# Patient Record
Sex: Male | Born: 1990 | Hispanic: Yes | Marital: Married | Smoking: Former smoker
Health system: Southern US, Community
[De-identification: ages and names within clinical notes are randomized; demographics above are authoritative.]

## PROBLEM LIST (undated history)

## (undated) DIAGNOSIS — S62336P Displaced fracture of neck of fifth metacarpal bone, right hand, subsequent encounter for fracture with malunion: Secondary | ICD-10-CM

## (undated) DIAGNOSIS — Z789 Other specified health status: Secondary | ICD-10-CM

## (undated) HISTORY — PX: NO PAST SURGERIES: SHX2092

---

## 2014-09-21 ENCOUNTER — Emergency Department (HOSPITAL_COMMUNITY)
Admission: EM | Admit: 2014-09-21 | Discharge: 2014-09-21 | Disposition: A | Discharge status: Home / Self Care | Payer: Self-pay | Attending: Emergency Medicine | Admitting: Emergency Medicine

## 2014-09-21 ENCOUNTER — Encounter (HOSPITAL_COMMUNITY): Payer: Self-pay | Admitting: Emergency Medicine

## 2014-09-21 DIAGNOSIS — Z720 Tobacco use: Secondary | ICD-10-CM | POA: Insufficient documentation

## 2014-09-21 DIAGNOSIS — B029 Zoster without complications: Secondary | ICD-10-CM | POA: Insufficient documentation

## 2014-09-21 MED ORDER — ACYCLOVIR 800 MG PO TABS: 800.0000 mg | ORAL_TABLET | Freq: Every day | ORAL | Status: DC

## 2014-09-21 MED ORDER — OXYCODONE-ACETAMINOPHEN 5-325 MG PO TABS
1.0000 | ORAL_TABLET | Freq: Once | ORAL | Status: AC
  Administered 2014-09-21: 1 via ORAL
  Filled 2014-09-21: qty 1

## 2014-09-21 MED ORDER — OXYCODONE-ACETAMINOPHEN 5-325 MG PO TABS: 1.0000 | ORAL_TABLET | Freq: Four times a day (QID) | ORAL | Status: DC | PRN

## 2014-09-21 NOTE — ED Provider Notes (Signed)
CSN: [id number]     Arrival date & time 09/21/14  0506 History   First MD Initiated Contact with Patient 09/21/14 0510     Chief Complaint  Patient presents with  . Herpes Zoster     (Consider location/radiation/quality/duration/timing/severity/associated sxs/prior Treatment) HPI  Patient states 1 week ago he was having some pain in his back. Then about 3 days later, July 26 he's noticed some small bumps on his upper back and neck area. He describes the lesions as burning and painful and itchy. He states that they are getting more fluid in them as the days go on. He states he's never had them before. He denies being around any known poison ivy or poison oak. He states he works in [employer]. He does play soccer but it's in a big open field. He was seen yesterday at urgent care and was prescribed prednisone 60 mg a day for 6 days and then a taper dose by Dr Wende Crease. However he was not told he had shingles.  PCP none  History reviewed. No pertinent past medical history. History reviewed. No pertinent past surgical history. History reviewed. No pertinent family history. History  Substance Use Topics  . Smoking status: Current Some Day Smoker  . Smokeless tobacco: Not on file  . Alcohol Use: No  employed  Review of Systems  All other systems reviewed and are negative.     Allergies  Review of patient's allergies indicates no known allergies.  Home Medications   Prior to Admission medications   Medication Sig Start Date End Date Taking? Authorizing Provider  acyclovir (ZOVIRAX) 800 MG tablet Take 1 tablet (800 mg total) by mouth 5 (five) times daily. 09/21/14   Devoria Albe, MD  oxyCODONE-acetaminophen (PERCOCET/ROXICET) 5-325 MG per tablet Take 1 tablet by mouth every 6 (six) hours as needed for severe pain. 09/21/14   Devoria Albe, MD   BP 133/88 mmHg  Pulse 76  Temp(Src) 99 F (37.2 C) (Oral)  Resp 18  Ht  (1.702 m)  Wt 140 lb (63.504 kg)  BMI 21.92 kg/m2  SpO2  100%  Vital signs normal   Physical Exam  Constitutional: He is oriented to person, place, and time. He appears well-developed and well-nourished.  Non-toxic appearance. He does not appear ill. No distress.  HENT:  Head: Normocephalic and atraumatic.  Right Ear: External ear normal.  Left Ear: External ear normal.  Nose: Nose normal. No mucosal edema or rhinorrhea.  Mouth/Throat: Mucous membranes are normal. No dental abscesses or uvula swelling.  Eyes: Conjunctivae and EOM are normal. Pupils are equal, round, and reactive to light.  Neck: Normal range of motion and full passive range of motion without pain. Neck supple.  Pulmonary/Chest: No respiratory distress. He has no rhonchi. He exhibits no crepitus.  Abdominal: Normal appearance.  Musculoskeletal: Normal range of motion.  Moves all extremities well.   Neurological: He is alert and oriented to person, place, and time. He has normal strength. No cranial nerve deficit.  Skin: Skin is warm, dry and intact. Rash noted. No erythema. No pallor.  Patient has a scattered unilateral rash some with clear fluid-filled blisters and some of the earlier ones  near his neck are turning purplish in approximately the C5 dermatome.  Psychiatric: He has a normal mood and affect. His speech is normal and behavior is normal. His mood appears not anxious.  Nursing note and vitals reviewed.        ED Course  Procedures (including critical care  time)  Medications  oxyCODONE-acetaminophen (PERCOCET/ROXICET) 5-325 MG per tablet 1 tablet (1 tablet Oral Given 09/21/14 1191)    We discussed adding acyclovir although it would've been better to start it sooner. Patient already has a prescription for prednisone he is encouraged to finish it. We discussed care of the lesions including Burow solution and Benadryl or Zyrtec for the itching component of the rash. We also discussed staying away from chemotherapy patients or pregnant patients.   Labs  Review Labs Reviewed - No data to display  Imaging Review No results found.   EKG Interpretation None      MDM   Final diagnoses:  Shingles    New Prescriptions   ACYCLOVIR (ZOVIRAX) 800 MG TABLET    Take 1 tablet (800 mg total) by mouth 5 (five) times daily.   OXYCODONE-ACETAMINOPHEN (PERCOCET/ROXICET) 5-325 MG PER TABLET    Take 1 tablet by mouth every 6 (six) hours as needed for severe pain.    Plan discharge  Devoria Albe, MD, Concha Pyo, MD 09/21/14 [phone]

## 2014-09-21 NOTE — Discharge Instructions (Signed)
Finish the prednisone you were already prescribed. Take the acyclovir until gone. Use the percocet for severe pain or so you can sleep. You can also take benadryl 25-50 mg OTC every 6 hrs for itching OR take zyrtec once daily for itching. You can use Burrow's solution on the blisters to help them dry up. STAY AWAY from any cancer patient getting chemotherapy or pregnant women.    Shingles Shingles (herpes zoster) is an infection that is caused by the same virus that causes chickenpox (varicella). The infection causes a painful skin rash and fluid-filled blisters, which eventually break open, crust over, and heal. It may occur in any area of the body, but it usually affects only one side of the body or face. The pain of shingles usually lasts about 1 month. However, some people with shingles may develop long-term (chronic) pain in the affected area of the body. Shingles often occurs many years after the person had chickenpox. It is more common:  In people older than 50 years.  In people with weakened immune systems, such as those with HIV, AIDS, or cancer.  In people taking medicines that weaken the immune system, such as transplant medicines.  In people under great stress. CAUSES  Shingles is caused by the varicella zoster virus (VZV), which also causes chickenpox. After a person is infected with the virus, it can remain in the person's body for years in an inactive state (dormant). To cause shingles, the virus reactivates and breaks out as an infection in a nerve root. The virus can be spread from person to person (contagious) through contact with open blisters of the shingles rash. It will only spread to people who have not had chickenpox. When these people are exposed to the virus, they may develop chickenpox. They will not develop shingles. Once the blisters scab over, the person is no longer contagious and cannot spread the virus to others. SIGNS AND SYMPTOMS  Shingles shows up in stages. The  initial symptoms may be pain, itching, and tingling in an area of the skin. This pain is usually described as burning, stabbing, or throbbing.In a few days or weeks, a painful red rash will appear in the area where the pain, itching, and tingling were felt. The rash is usually on one side of the body in a band or belt-like pattern. Then, the rash usually turns into fluid-filled blisters. They will scab over and dry up in approximately 2-3 weeks. Flu-like symptoms may also occur with the initial symptoms, the rash, or the blisters. These may include:  Fever.  Chills.  Headache.  Upset stomach. DIAGNOSIS  Your health care provider will perform a skin exam to diagnose shingles. Skin scrapings or fluid samples may also be taken from the blisters. This sample will be examined under a microscope or sent to a lab for further testing. TREATMENT  There is no specific cure for shingles. Your health care provider will likely prescribe medicines to help you manage the pain, recover faster, and avoid long-term problems. This may include antiviral drugs, anti-inflammatory drugs, and pain medicines. HOME CARE INSTRUCTIONS   Take a cool bath or apply cool compresses to the area of the rash or blisters as directed. This may help with the pain and itching.   Take medicines only as directed by your health care provider.   Rest as directed by your health care provider.  Keep your rash and blisters clean with mild soap and cool water or as directed by your health care provider.  Do not pick your blisters or scratch your rash. Apply an anti-itch cream or numbing creams to the affected area as directed by your health care provider.  Keep your shingles rash covered with a loose bandage (dressing).  Avoid skin contact with:  Babies.   Pregnant women.   Children with eczema.   Elderly people with transplants.   People with chronic illnesses, such as leukemia or AIDS.   Wear loose-fitting  clothing to help ease the pain of material rubbing against the rash.  Keep all follow-up visits as directed by your health care provider.If the area involved is on your face, you may receive a referral for a specialist, such as an eye doctor (ophthalmologist) or an ear, nose, and throat (ENT) doctor. Keeping all follow-up visits will help you avoid eye problems, chronic pain, or disability.  SEEK IMMEDIATE MEDICAL CARE IF:   You have facial pain, pain around the eye area, or loss of feeling on one side of your face.  You have ear pain or ringing in your ear.  You have loss of taste.  Your pain is not relieved with prescribed medicines.   Your redness or swelling spreads.   You have more pain and swelling.  Your condition is worsening or has changed.   You have a fever. MAKE SURE YOU:  Understand these instructions.  Will watch your condition.  Will get help right away if you are not doing well or get worse. Document Released: 02/08/2005 Document Revised: 06/25/2013 Document Reviewed: 09/23/2011 [facility] Patient Information 2015 [place], [place]. This information is not intended to replace advice given to you by your health care provider. Make sure you discuss any questions you have with your health care provider.

## 2014-09-21 NOTE — ED Notes (Signed)
Patient complaining of painful rash to back of neck and running across top of shoulders and around to chest. States he noticed the rash Tuesday, and it has spread and worsened.

## 2015-12-16 ENCOUNTER — Emergency Department (HOSPITAL_COMMUNITY)
Admission: EM | Admit: 2015-12-16 | Discharge: 2015-12-16 | Disposition: A | Discharge status: Home / Self Care | Payer: Self-pay | Attending: Emergency Medicine | Admitting: Emergency Medicine

## 2015-12-16 ENCOUNTER — Encounter (HOSPITAL_COMMUNITY): Payer: Self-pay | Admitting: Emergency Medicine

## 2015-12-16 ENCOUNTER — Emergency Department (HOSPITAL_COMMUNITY): Payer: Self-pay

## 2015-12-16 DIAGNOSIS — F17200 Nicotine dependence, unspecified, uncomplicated: Secondary | ICD-10-CM | POA: Insufficient documentation

## 2015-12-16 DIAGNOSIS — Y9389 Activity, other specified: Secondary | ICD-10-CM | POA: Insufficient documentation

## 2015-12-16 DIAGNOSIS — Y929 Unspecified place or not applicable: Secondary | ICD-10-CM | POA: Insufficient documentation

## 2015-12-16 DIAGNOSIS — S62326A Displaced fracture of shaft of fifth metacarpal bone, right hand, initial encounter for closed fracture: Secondary | ICD-10-CM | POA: Insufficient documentation

## 2015-12-16 DIAGNOSIS — S62306A Unspecified fracture of fifth metacarpal bone, right hand, initial encounter for closed fracture: Secondary | ICD-10-CM

## 2015-12-16 DIAGNOSIS — W2201XA Walked into wall, initial encounter: Secondary | ICD-10-CM | POA: Insufficient documentation

## 2015-12-16 DIAGNOSIS — Y999 Unspecified external cause status: Secondary | ICD-10-CM | POA: Insufficient documentation

## 2015-12-16 MED ORDER — HYDROCODONE-ACETAMINOPHEN 5-325 MG PO TABS: 1.0000 | ORAL_TABLET | ORAL | 0 refills | Status: DC | PRN

## 2015-12-16 NOTE — ED Triage Notes (Signed)
Patient states he punched a wall about a week ago with right hand. States pain and swelling right hand.

## 2015-12-16 NOTE — Discharge Instructions (Signed)
Follow up with the hand doctor as this injury will need surgery. Do not drive or operate heavy machinery while taking the pain medication. Return to the ED if you develop new or worsening symptoms.

## 2015-12-16 NOTE — ED Notes (Signed)
Pt made aware to return if symptoms worsen or if any life threatening symptoms occur.   

## 2015-12-16 NOTE — ED Provider Notes (Signed)
[facility]-EMERGENCY DEPT Provider Note   CSN: [id number] Arrival date & time: 12/16/15  [id number]  By signing my name below, I, Sonum Patel, attest that this documentation has been prepared under the direction and in the presence of Glynn OctaveStephen Wylie Russon, MD. Electronically Signed: Sonum Patel, Neurosurgeoncribe. 12/16/15. 8:35 AM.  History   Chief Complaint Chief Complaint  Patient presents with  . Hand Injury    The history is provided by the patient. No language interpreter was used.     HPI Comments: Charles Washington is a 25 y.o. male who presents to the Emergency Department complaining of constant, unchanged right hand pain with associated swelling for the past 10 days after punching a wall. He states he has not yet been evaluated for his symptoms. He denies wrist pain.    History reviewed. No pertinent past medical history.  There are no active problems to display for this patient.   History reviewed. No pertinent surgical history.     Home Medications    Prior to Admission medications   Medication Sig Start Date End Date Taking? Authorizing Provider  acyclovir (ZOVIRAX) 800 MG tablet Take 1 tablet (800 mg total) by mouth 5 (five) times daily. 09/21/14   Devoria AlbeIva Knapp, MD  oxyCODONE-acetaminophen (PERCOCET/ROXICET) 5-325 MG per tablet Take 1 tablet by mouth every 6 (six) hours as needed for severe pain. 09/21/14   Devoria AlbeIva Knapp, MD    Family History No family history on file.  Social History Social History  Substance Use Topics  . Smoking status: Current Some Day Smoker  . Smokeless tobacco: Never Used  . Alcohol use Yes     Comment: occasonally     Allergies   Review of patient's allergies indicates no known allergies.   Review of Systems Review of Systems  A complete 10 system review of systems was obtained and all systems are negative except as noted in the HPI and PMH.    Physical Exam Updated Vital Signs BP 139/94 (BP Location: Left Arm)   Pulse 83   Temp 98.4 F (36.9 C) (Oral)    Resp 16   Ht 5\' 7"  (1.702 m)   Wt 170 lb (77.1 kg)   SpO2 99%   BMI 26.63 kg/m   Physical Exam  Constitutional: He is oriented to person, place, and time. He appears well-developed and well-nourished. No distress.  HENT:  Head: Normocephalic and atraumatic.  Mouth/Throat: Oropharynx is clear and moist. No oropharyngeal exudate.  Eyes: Conjunctivae and EOM are normal. Pupils are equal, round, and reactive to light.  Neck: Normal range of motion. Neck supple.  No meningismus.  Cardiovascular: Normal rate, regular rhythm, normal heart sounds and intact distal pulses.   No murmur heard. Pulmonary/Chest: Effort normal and breath sounds normal. No respiratory distress.  Abdominal: Soft. There is no tenderness. There is no rebound and no guarding.  Musculoskeletal: Normal range of motion. He exhibits tenderness and deformity. He exhibits no edema.  Deformity to right 5th metacarpal with tenderness. No open wounds. Full ROM of fingers. No snuffbox tenderness. Intact radial pulse.   Neurological: He is alert and oriented to person, place, and time. No cranial nerve deficit. He exhibits normal muscle tone. Coordination normal.  No ataxia on finger to nose bilaterally. No pronator drift. 5/5 strength throughout. CN 2-12 intact.Equal grip strength. Sensation intact.   Skin: Skin is warm.  Psychiatric: He has a normal mood and affect. His behavior is normal.  Nursing note and vitals reviewed.    ED Treatments /  Results  DIAGNOSTIC STUDIES: Oxygen Saturation is 99% on RA, normal by my interpretation.    COORDINATION OF CARE: 8:34 AM Discussed treatment plan with pt at bedside and pt agreed to plan.    Labs (all labs ordered are listed, but only abnormal results are displayed) Labs Reviewed - No data to display  EKG  EKG Interpretation None       Radiology Dg Hand Complete Right  Result Date: 12/16/2015 CLINICAL DATA:  Punched a wall 4 days ago. Right fifth metacarpal pain.  EXAM: RIGHT HAND - COMPLETE 3+ VIEW COMPARISON:  None. FINDINGS: There is an angulated fracture through the midportion of the right fifth metacarpal. No subluxation or dislocation. Overlying soft tissue swelling. IMPRESSION: Mildly angulated right fifth metacarpal fracture. Electronically Signed   By: Charlett Nose M.D.   On: 12/16/2015 09:07    Procedures Procedures (including critical care time)  Medications Ordered in ED Medications - No data to display   Initial Impression / Assessment and Plan / ED Course  I have reviewed the triage vital signs and the nursing notes.  Pertinent labs & imaging results that were available during my care of the patient were reviewed by me and considered in my medical decision making (see chart for details).  Clinical Course   Right hand injury after punching wall tenderness ago. No open wounds. Neurovascular intact.  X-ray confirms midshaft angulated right fifth metacarpal fracture.  D/w OR nurse for Dr. Dion Saucier.  Recommends splinting, does not recommend attempted reduction. Followup in office today or tomorrow.    Final Clinical Impressions(s) / ED Diagnoses   Final diagnoses:  Closed fracture of fifth metacarpal bone of right hand, unspecified fracture morphology, initial encounter    New Prescriptions New Prescriptions   No medications on file   I personally performed the services described in this documentation, which was scribed in my presence. The recorded information has been reviewed and is accurate.    Glynn Octave, MD 12/16/15 [phone]

## 2015-12-17 ENCOUNTER — Other Ambulatory Visit: Payer: Self-pay | Admitting: Orthopedic Surgery

## 2015-12-17 NOTE — Progress Notes (Signed)
Spoke with pt's girlfriend, she states pt is at work and will not be home until after 10 PM. She states I could give her the pre-op instructions. Sh voiced understanding after instructions were given.

## 2015-12-18 ENCOUNTER — Ambulatory Visit (HOSPITAL_COMMUNITY): Payer: Self-pay | Admitting: Anesthesiology

## 2015-12-18 ENCOUNTER — Encounter (HOSPITAL_COMMUNITY)
Admission: RE | Disposition: A | Discharge status: Home / Self Care | Payer: Self-pay | Source: Ambulatory Visit | Attending: Orthopedic Surgery

## 2015-12-18 ENCOUNTER — Ambulatory Visit (HOSPITAL_COMMUNITY)
Admission: RE | Admit: 2015-12-18 | Discharge: 2015-12-18 | Disposition: A | Discharge status: Home / Self Care | Payer: Self-pay | Source: Ambulatory Visit | Attending: Orthopedic Surgery | Admitting: Orthopedic Surgery

## 2015-12-18 ENCOUNTER — Encounter (HOSPITAL_COMMUNITY): Payer: Self-pay | Admitting: Urology

## 2015-12-18 DIAGNOSIS — Z87891 Personal history of nicotine dependence: Secondary | ICD-10-CM | POA: Insufficient documentation

## 2015-12-18 DIAGNOSIS — X58XXXA Exposure to other specified factors, initial encounter: Secondary | ICD-10-CM | POA: Insufficient documentation

## 2015-12-18 DIAGNOSIS — S62336P Displaced fracture of neck of fifth metacarpal bone, right hand, subsequent encounter for fracture with malunion: Secondary | ICD-10-CM

## 2015-12-18 DIAGNOSIS — S62326A Displaced fracture of shaft of fifth metacarpal bone, right hand, initial encounter for closed fracture: Secondary | ICD-10-CM | POA: Insufficient documentation

## 2015-12-18 HISTORY — DX: Other specified health status: Z78.9

## 2015-12-18 HISTORY — DX: Displaced fracture of neck of fifth metacarpal bone, right hand, subsequent encounter for fracture with malunion: S62.336P

## 2015-12-18 HISTORY — PX: CLOSED REDUCTION METACARPAL WITH PERCUTANEOUS PINNING: SHX5613

## 2015-12-18 LAB — CBC
HEMATOCRIT: 45.5 % (ref 39.0–52.0)
HEMOGLOBIN: 16 g/dL (ref 13.0–17.0)
MCH: 32.2 pg (ref 26.0–34.0)
MCHC: 35.2 g/dL (ref 30.0–36.0)
MCV: 91.5 fL (ref 78.0–100.0)
Platelets: 211 10*3/uL (ref 150–400)
RBC: 4.97 MIL/uL (ref 4.22–5.81)
RDW: 12.8 % (ref 11.5–15.5)
WBC: 5.9 10*3/uL (ref 4.0–10.5)

## 2015-12-18 SURGERY — CLOSED REDUCTION, FRACTURE, METACARPAL BONE, WITH PERCUTANEOUS PINNING
Anesthesia: Regional | Site: Hand | Laterality: Right

## 2015-12-18 MED ORDER — ONDANSETRON HCL 4 MG/2ML IJ SOLN
INTRAMUSCULAR | Status: AC
  Filled 2015-12-18: qty 2

## 2015-12-18 MED ORDER — PROPOFOL 10 MG/ML IV BOLUS
INTRAVENOUS | Status: DC | PRN
  Administered 2015-12-18: 50 mg via INTRAVENOUS
  Administered 2015-12-18: 70 mg via INTRAVENOUS

## 2015-12-18 MED ORDER — MEPERIDINE HCL 25 MG/ML IJ SOLN: 6.2500 mg | INTRAMUSCULAR | Status: DC | PRN

## 2015-12-18 MED ORDER — FENTANYL CITRATE (PF) 100 MCG/2ML IJ SOLN
INTRAMUSCULAR | Status: AC
  Administered 2015-12-18: 100 ug
  Filled 2015-12-18: qty 2

## 2015-12-18 MED ORDER — CEFAZOLIN SODIUM-DEXTROSE 2-4 GM/100ML-% IV SOLN
INTRAVENOUS | Status: AC
Start: 2015-12-18 — End: 2015-12-18
  Filled 2015-12-18: qty 100

## 2015-12-18 MED ORDER — PROPOFOL 10 MG/ML IV BOLUS
INTRAVENOUS | Status: AC
  Filled 2015-12-18: qty 20

## 2015-12-18 MED ORDER — FENTANYL CITRATE (PF) 100 MCG/2ML IJ SOLN
INTRAMUSCULAR | Status: AC
  Filled 2015-12-18: qty 4

## 2015-12-18 MED ORDER — MIDAZOLAM HCL 2 MG/2ML IJ SOLN
INTRAMUSCULAR | Status: AC
Start: 2015-12-18 — End: 2015-12-18
  Administered 2015-12-18: 2 mg
  Filled 2015-12-18: qty 2

## 2015-12-18 MED ORDER — HYDROMORPHONE HCL 1 MG/ML IJ SOLN: 0.2500 mg | INTRAMUSCULAR | Status: DC | PRN

## 2015-12-18 MED ORDER — SENNA-DOCUSATE SODIUM 8.6-50 MG PO TABS: 2.0000 | ORAL_TABLET | Freq: Every day | ORAL | 1 refills | Status: AC

## 2015-12-18 MED ORDER — ONDANSETRON HCL 4 MG/2ML IJ SOLN
4.0000 mg | Freq: Once | INTRAMUSCULAR | Status: DC | PRN
Start: 2015-12-18 — End: 2015-12-18

## 2015-12-18 MED ORDER — PROPOFOL 500 MG/50ML IV EMUL
INTRAVENOUS | Status: DC | PRN
  Administered 2015-12-18: 75 ug/kg/min via INTRAVENOUS

## 2015-12-18 MED ORDER — MIDAZOLAM HCL 2 MG/2ML IJ SOLN
INTRAMUSCULAR | Status: AC
  Filled 2015-12-18: qty 2

## 2015-12-18 MED ORDER — SUGAMMADEX SODIUM 500 MG/5ML IV SOLN
INTRAVENOUS | Status: AC
  Filled 2015-12-18: qty 5

## 2015-12-18 MED ORDER — DEXMEDETOMIDINE HCL IN NACL 200 MCG/50ML IV SOLN
INTRAVENOUS | Status: AC
  Filled 2015-12-18: qty 50

## 2015-12-18 MED ORDER — SUGAMMADEX SODIUM 200 MG/2ML IV SOLN
INTRAVENOUS | Status: AC
  Filled 2015-12-18: qty 2

## 2015-12-18 MED ORDER — LIDOCAINE 2% (20 MG/ML) 5 ML SYRINGE
INTRAMUSCULAR | Status: AC
  Filled 2015-12-18: qty 5

## 2015-12-18 MED ORDER — LACTATED RINGERS IV SOLN
INTRAVENOUS | Status: DC
  Administered 2015-12-18: 12:00:00 via INTRAVENOUS

## 2015-12-18 MED ORDER — PROPOFOL 1000 MG/100ML IV EMUL
INTRAVENOUS | Status: AC
  Filled 2015-12-18: qty 100

## 2015-12-18 MED ORDER — BUPIVACAINE HCL (PF) 0.25 % IJ SOLN
INTRAMUSCULAR | Status: AC
Start: 2015-12-18 — End: 2015-12-18
  Filled 2015-12-18: qty 30

## 2015-12-18 MED ORDER — CEFAZOLIN SODIUM-DEXTROSE 2-4 GM/100ML-% IV SOLN
2.0000 g | INTRAVENOUS | Status: AC
  Administered 2015-12-18: 2 g via INTRAVENOUS

## 2015-12-18 MED ORDER — LIDOCAINE HCL (CARDIAC) 20 MG/ML IV SOLN
INTRAVENOUS | Status: DC | PRN
  Administered 2015-12-18: 100 mg via INTRAVENOUS

## 2015-12-18 MED ORDER — OXYCODONE-ACETAMINOPHEN 5-325 MG PO TABS: 1.0000 | ORAL_TABLET | Freq: Four times a day (QID) | ORAL | 0 refills | Status: AC | PRN

## 2015-12-18 SURGICAL SUPPLY — 46 items
BANDAGE ACE 4X5 VEL STRL LF (GAUZE/BANDAGES/DRESSINGS) ×4 IMPLANT
BANDAGE ELASTIC 3 VELCRO ST LF (GAUZE/BANDAGES/DRESSINGS) ×4 IMPLANT
BENZOIN TINCTURE PRP APPL 2/3 (GAUZE/BANDAGES/DRESSINGS) ×4 IMPLANT
BNDG ESMARK 4X9 LF (GAUZE/BANDAGES/DRESSINGS) ×4 IMPLANT
CLOSURE STERI-STRIP 1/2X4 (GAUZE/BANDAGES/DRESSINGS) ×1
CLSR STERI-STRIP ANTIMIC 1/2X4 (GAUZE/BANDAGES/DRESSINGS) ×3 IMPLANT
CORDS BIPOLAR (ELECTRODE) ×4 IMPLANT
COVER SURGICAL LIGHT HANDLE (MISCELLANEOUS) ×4 IMPLANT
CUFF TOURNIQUET SINGLE 18IN (TOURNIQUET CUFF) ×4 IMPLANT
DRAPE OEC MINIVIEW 54X84 (DRAPES) ×4 IMPLANT
DRAPE U-SHAPE 47X51 STRL (DRAPES) ×4 IMPLANT
ELECT REM PT RETURN 9FT ADLT (ELECTROSURGICAL) ×4
ELECTRODE REM PT RTRN 9FT ADLT (ELECTROSURGICAL) ×2 IMPLANT
GAUZE SPONGE 4X4 12PLY STRL (GAUZE/BANDAGES/DRESSINGS) ×4 IMPLANT
GAUZE XEROFORM 1X8 LF (GAUZE/BANDAGES/DRESSINGS) ×4 IMPLANT
GLOVE BIOGEL PI ORTHO PRO SZ8 (GLOVE) ×2
GLOVE ORTHO TXT STRL SZ7.5 (GLOVE) ×4 IMPLANT
GLOVE PI ORTHO PRO STRL SZ8 (GLOVE) ×2 IMPLANT
GLOVE SS BIOGEL STRL SZ 8 (GLOVE) ×2 IMPLANT
GLOVE SUPERSENSE BIOGEL SZ 8 (GLOVE) ×2
GLOVE SURG ORTHO 8.0 STRL STRW (GLOVE) ×4 IMPLANT
K-WIRE .045 CH (WIRE) ×8
K-WIRE DBL TROCAR .045X4 ×8 IMPLANT
KIT ROOM TURNOVER OR (KITS) ×4 IMPLANT
KWIRE .045 CH (WIRE) ×4 IMPLANT
KWIRE DBL TROCAR .045X4 ×4 IMPLANT
LOOP VESSEL MAXI BLUE (MISCELLANEOUS) ×4 IMPLANT
MANIFOLD NEPTUNE II (INSTRUMENTS) ×4 IMPLANT
NS IRRIG 1000ML POUR BTL (IV SOLUTION) ×8 IMPLANT
PACK ORTHO EXTREMITY (CUSTOM PROCEDURE TRAY) ×4 IMPLANT
PAD ARMBOARD 7.5X6 YLW CONV (MISCELLANEOUS) ×8 IMPLANT
PAD CAST 4YDX4 CTTN HI CHSV (CAST SUPPLIES) ×2 IMPLANT
PADDING CAST COTTON 4X4 STRL (CAST SUPPLIES) ×2
SPONGE LAP 4X18 X RAY DECT (DISPOSABLE) ×4 IMPLANT
STAPLER VISISTAT 35W (STAPLE) IMPLANT
SUCTION FRAZIER HANDLE 10FR (MISCELLANEOUS) ×2
SUCTION TUBE FRAZIER 10FR DISP (MISCELLANEOUS) ×2 IMPLANT
SUT ETHILON 4 0 PS 2 18 (SUTURE) IMPLANT
SUT PROLENE 4 0 PS 2 18 (SUTURE) IMPLANT
SUT VIC AB 3-0 FS2 27 (SUTURE) IMPLANT
SYR CONTROL 10ML LL (SYRINGE) IMPLANT
TOWEL OR 17X24 6PK STRL BLUE (TOWEL DISPOSABLE) ×4 IMPLANT
TOWEL OR 17X26 10 PK STRL BLUE (TOWEL DISPOSABLE) ×4 IMPLANT
TUBE CONNECTING 12'X1/4 (SUCTIONS) ×1
TUBE CONNECTING 12X1/4 (SUCTIONS) ×3 IMPLANT
WATER STERILE IRR 1000ML POUR (IV SOLUTION) ×8 IMPLANT

## 2015-12-18 NOTE — H&P (Signed)
PREOPERATIVE H&P  Chief Complaint: RIGHT FIFTH METACARPAL fracture  HPI: Charles Washington is a 25 y.o. male who presents for preoperative history and physical with a diagnosis of RIGHT FIFTH METACARPAL. Symptoms are rated as moderate to severe, and have been worsening.  This is significantly impairing activities of daily living.  He has elected for surgical management. He broke it approximately a week and a half ago, and presented to the ER 3 nights ago, seen in the office yesterday, and had significant displacement and elected for surgical management.  Past Medical History:  Diagnosis Date  . Medical history non-contributory    Past Surgical History:  Procedure Laterality Date  . NO PAST SURGERIES     Social History   Social History  . Marital status: Single    Spouse name: N/A  . Number of children: N/A  . Years of education: N/A   Social History Main Topics  . Smoking status: Former Games developermoker  . Smokeless tobacco: Never Used     Comment: quit 1 month ago  . Alcohol use 4.2 oz/week    7 Cans of beer per week  . Drug use: No  . Sexual activity: Not Asked   Other Topics Concern  . None   Social History Narrative  . None   History reviewed. No pertinent family history. No Known Allergies Prior to Admission medications   Medication Sig Start Date End Date Taking? Authorizing Provider  HYDROcodone-acetaminophen (NORCO/VICODIN) 5-325 MG tablet Take 1 tablet by mouth every 4 (four) hours as needed. 12/16/15  Yes Glynn OctaveStephen Rancour, MD     Positive ROS: All other systems have been reviewed and were otherwise negative with the exception of those mentioned in the HPI and as above.  Physical Exam: General: Alert, no acute distress Cardiovascular: No pedal edema Respiratory: No cyanosis, no use of accessory musculature GI: No organomegaly, abdomen is soft and non-tender Skin: No lesions in the area of chief complaint Neurologic: Sensation intact distally Psychiatric: Patient is  competent for consent with normal mood and affect Lymphatic: No axillary or cervical lymphadenopathy  MUSCULOSKELETAL: Right hand has a small scar that is old over the distal metacarpal, and has significant gross deformity with angulation and mild pain to palpation over the fifth metacarpal fracture. Rotational alignment is intact.  Assessment: RIGHT FIFTH METACARPAL fracture, that is almost a malunion at this point with over 40 of angulation.   Plan: Plan for Procedure(s): CLOSED REDUCTION METACARPAL WITH PERCUTANEOUS PINNING VERSUS OPEN REDUCTION INTERNAL FIXATION (ORIF) FIFTH METACARPAL  The risks benefits and alternatives were discussed with the patient including but not limited to the risks of nonoperative treatment, versus surgical intervention including infection, bleeding, nerve injury, malunion, nonunion, the need for revision surgery, hardware prominence, hardware failure, the need for hardware removal, blood clots, cardiopulmonary complications, morbidity, mortality, among others, and they were willing to proceed.    Eulas PostLANDAU,Michala Deblanc P, MD Cell [phone]   12/18/2015 2:58 PM

## 2015-12-18 NOTE — Discharge Instructions (Signed)
Diet: As you were doing prior to hospitalization   Shower:  May shower but keep the wounds dry, use an occlusive plastic wrap, NO SOAKING IN TUB.  If the bandage gets wet, change with a clean dry gauze.  If you have a splint on, leave the splint in place and keep the splint dry with a plastic bag.  Dressing:  You may change your dressing 3-5 days after surgery, unless you have a splint.  If you have a splint, then just leave the splint in place and we will change your bandages during your first follow-up appointment.    If you had hand or foot surgery, we will plan to remove your stitches in about 2 weeks in the office.  For all other surgeries, there are sticky tapes (steri-strips) on your wounds and all the stitches are absorbable.  Leave the steri-strips in place when changing your dressings, they will peel off with time, usually 2-3 weeks.  Activity:  Increase activity slowly as tolerated, but follow the weight bearing instructions below.  The rules on driving is that you can not be taking narcotics while you drive, and you must feel in control of the vehicle.    Weight Bearing:   No lifting or use of right hand.    To prevent constipation: you may use a stool softener such as -  Colace (over the counter) 100 mg by mouth twice a day  Drink plenty of fluids (prune juice may be helpful) and high fiber foods Miralax (over the counter) for constipation as needed.    Itching:  If you experience itching with your medications, try taking only a single pain pill, or even half a pain pill at a time.  You may take up to 10 pain pills per day, and you can also use benadryl over the counter for itching or also to help with sleep.   Precautions:  If you experience chest pain or shortness of breath - call 911 immediately for transfer to the hospital emergency department!!  If you develop a fever greater that 101 F, purulent drainage from wound, increased redness or drainage from wound, or calf pain --  Call the office at [phone]                                                Follow- Up Appointment:  Please call for an appointment [place] [phone]

## 2015-12-18 NOTE — Op Note (Signed)
12/18/2015  4:13 PM  PATIENT:  [facility]    PRE-OPERATIVE DIAGNOSIS:  Right fifth metacarpal shaft fracture, with near malunion  POST-OPERATIVE DIAGNOSIS:  Same  PROCEDURE:  CLOSED REDUCTION METACARPAL WITH PERCUTANEOUS PINNING  SURGEON:  Eulas PostLANDAU,Kwynn Schlotter P, MD  PHYSICIAN ASSISTANT: Janace LittenBrandon Parry, OPA-C, present and scrubbed throughout the case, critical for completion in a timely fashion, and for retraction, instrumentation, and closure.  ANESTHESIA:   General  PREOPERATIVE INDICATIONS:  [facility] is a  25 y.o. male who broke his right fifth metacarpal on this 2 weeks ago, and had significant deformity with 40 angulation and elected for surgical management. He had minimal tenderness in the office.  The risks benefits and alternatives were discussed with the patient preoperatively including but not limited to the risks of infection, bleeding, nerve injury, cardiopulmonary complications, the need for revision surgery, among others, and the patient was willing to proceed. We also discussed the potential for malunion, rotational malalignment, need for revision surgery, hardware prominence, hardware failure, need for hardware removal, among others.  OPERATIVE IMPLANTS: 0.045 inch K wire 2.   OPERATIVE FINDINGS: The fracture was nearly completely healed, although I was able to mobilize it and rebroke it manually without having to open it up. I was able to achieve anatomic alignment, and get two K wires across the fracture site.  OPERATIVE PROCEDURE: The patient is brought to the operating room placed in supine position. Regional anesthesia was administered. The right upper extremity was manipulated and I was able to manipulate the fracture and get mobile enough to achieve a closed reduction. This took a fair amount of force. Nonetheless I did achieve satisfactory position, then performed a sterile prep and drape of the right upper extremity, timeout performed for the second time, I did first  before the manipulation, and then introduced a 0.045 inch K wire into the metacarpal head retrograde going into the shaft. The fracture was reduced anatomically and the wire passed easily and I buried it in the base of the fifth metacarpal.  I then placed a second K wire, this K wire was more challenging to get across, I did get it across the fracture site and it embedded in the cortex proximally. I couldn't get it to make the turn, as it had a slight path into the cortical bone, but I had good fixation in a derotational and stabilization type pattern and so I accepted that I did not get down to the base completely, but did get it across the fracture providing adequate stability.  I checked the stability of the fracture under live fluoroscopy and it was found to be stable. The pins were bent and cut and then a ulnar gutter splint applied. At the completion of the case he had a small event of coughing, although maintained his oxygen saturation, and this was managed successfully by anesthesia.  He tolerated the procedure well and there were no complications.

## 2015-12-18 NOTE — Anesthesia Postprocedure Evaluation (Signed)
Anesthesia Post Note  Patient: [facility]  Procedure(s) Performed: Procedure(s) (LRB): CLOSED REDUCTION METACARPAL WITH PERCUTANEOUS PINNING (Right)  Patient location during evaluation: PACU Anesthesia Type: MAC and Regional Level of consciousness: awake and alert Pain management: pain level controlled Vital Signs Assessment: post-procedure vital signs reviewed and stable Respiratory status: spontaneous breathing, nonlabored ventilation, respiratory function stable and patient connected to nasal cannula oxygen Cardiovascular status: stable and blood pressure returned to baseline Anesthetic complications: no    Last Vitals:  Vitals:   12/18/15 1649 12/18/15 1651  BP: 114/65 117/76  Pulse: 74 72  Resp: 13 12  Temp:  36.4 C    Last Pain:  Vitals:   12/18/15 1651  PainSc: 0-No pain                 Marit Goodwill,W. EDMOND

## 2015-12-18 NOTE — Anesthesia Procedure Notes (Signed)
Procedure Name: MAC Date/Time: 12/18/2015 3:42 PM Performed by: Rosiland OzMEYERS, Adalae Baysinger Pre-anesthesia Checklist: Patient identified, Emergency Drugs available, Suction available, Patient being monitored and Timeout performed Patient Re-evaluated:Patient Re-evaluated prior to inductionOxygen Delivery Method: Simple face mask

## 2015-12-18 NOTE — Anesthesia Preprocedure Evaluation (Addendum)
Anesthesia Evaluation  Patient identified by MRN, date of birth, ID band Patient awake    Reviewed: Allergy & Precautions, NPO status , Patient's Chart, lab work & pertinent test results  Airway Mallampati: I  TM Distance: >3 FB Neck ROM: Full    Dental   Pulmonary former smoker,    Pulmonary exam normal        Cardiovascular Normal cardiovascular exam     Neuro/Psych    GI/Hepatic   Endo/Other    Renal/GU      Musculoskeletal   Abdominal   Peds  Hematology   Anesthesia Other Findings   Reproductive/Obstetrics                             Anesthesia Physical Anesthesia Plan  ASA: I  Anesthesia Plan: Regional   Post-op Pain Management:    Induction: Intravenous  Airway Management Planned: Simple Face Mask  Additional Equipment:   Intra-op Plan:   Post-operative Plan:   Informed Consent: I have reviewed the patients History and Physical, chart, labs and discussed the procedure including the risks, benefits and alternatives for the proposed anesthesia with the patient or authorized representative who has indicated his/her understanding and acceptance.     Plan Discussed with: CRNA and Surgeon  Anesthesia Plan Comments:         Anesthesia Quick Evaluation

## 2015-12-18 NOTE — Anesthesia Procedure Notes (Signed)
Anesthesia Regional Block:  Supraclavicular block  Pre-Anesthetic Checklist: ,, timeout performed, Correct Patient, Correct Site, Correct Laterality, Correct Procedure, Correct Position, site marked, Risks and benefits discussed,  Surgical consent,  Pre-op evaluation,  At surgeon's request and post-op pain management  Laterality: Right  Prep: chloraprep       Needles:   Needle Type: Echogenic Stimulator Needle     Needle Length: 9cm 9 cm Needle Gauge: 21 and 21 G    Additional Needles:  Procedures: ultrasound guided (picture in chart) and nerve stimulator Supraclavicular block  Nerve Stimulator or Paresthesia:  Response: 0.4 mA,   Additional Responses:   Narrative:  Start time: 12/18/2015 3:10 PM End time: 12/18/2015 3:20 PM Injection made incrementally with aspirations every 5 mL.  Performed by: Personally  Anesthesiologist: Arta BruceSSEY, Shannon Balthazar  Additional Notes: Monitors applied. Patient sedated. Sterile prep and drape,hand hygiene and sterile gloves were used. Relevant anatomy identified.Needle position confirmed.Local anesthetic injected incrementally after negative aspiration. Local anesthetic spread visualized around nerve(s). Vascular puncture avoided. No complications. Image printed for medical record.The patient tolerated the procedure well.

## 2015-12-18 NOTE — Transfer of Care (Signed)
Immediate Anesthesia Transfer of Care Note  Patient: [facility]  Procedure(s) Performed: Procedure(s): CLOSED REDUCTION METACARPAL WITH PERCUTANEOUS PINNING (Right)  Patient Location: PACU  Anesthesia Type:MAC  Level of Consciousness: awake, alert , oriented and patient cooperative  Airway & Oxygen Therapy: Patient Spontanous Breathing  Post-op Assessment: Report given to RN and Post -op Vital signs reviewed and stable  Post vital signs: Reviewed and stable  Last Vitals:  Vitals:   12/18/15 1515 12/18/15 1622  BP: 137/69 112/67  Pulse: 69 87  Resp: 12 14  Temp:  36.9 C    Last Pain:  Vitals:   12/18/15 1622  PainSc: (P) 0-No pain         Complications: No apparent anesthesia complications

## 2015-12-19 ENCOUNTER — Encounter (HOSPITAL_COMMUNITY): Payer: Self-pay | Admitting: Orthopedic Surgery

## 2018-02-16 IMAGING — DX DG HAND COMPLETE 3+V*R*
3 series · 3 of 3 positions shown · non-contrast
Comparison: None.

CLINICAL DATA: Punched a wall 4 days ago. Right fifth metacarpal
pain.

EXAM:
RIGHT HAND - COMPLETE 3+ VIEW

[hand pa]
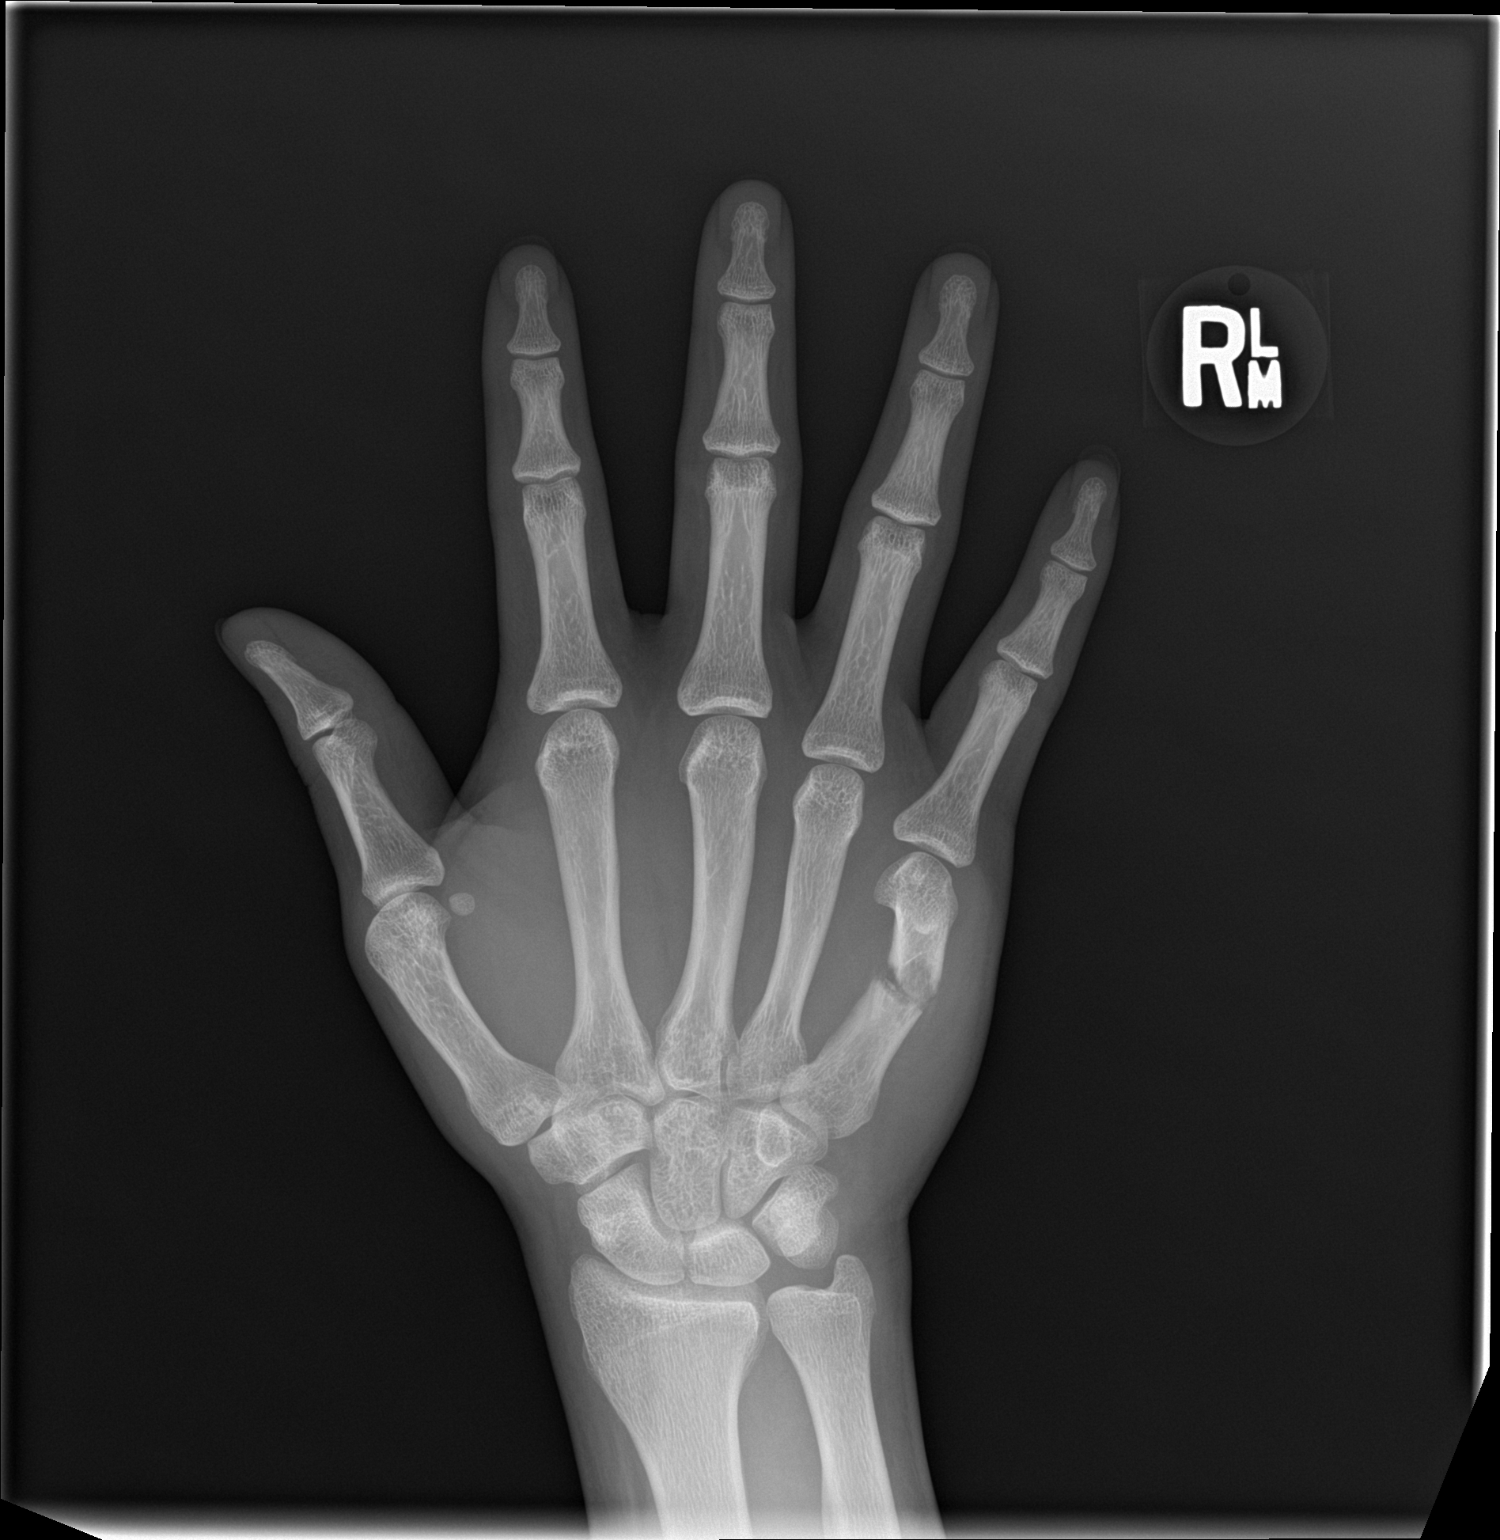

[hand obl]
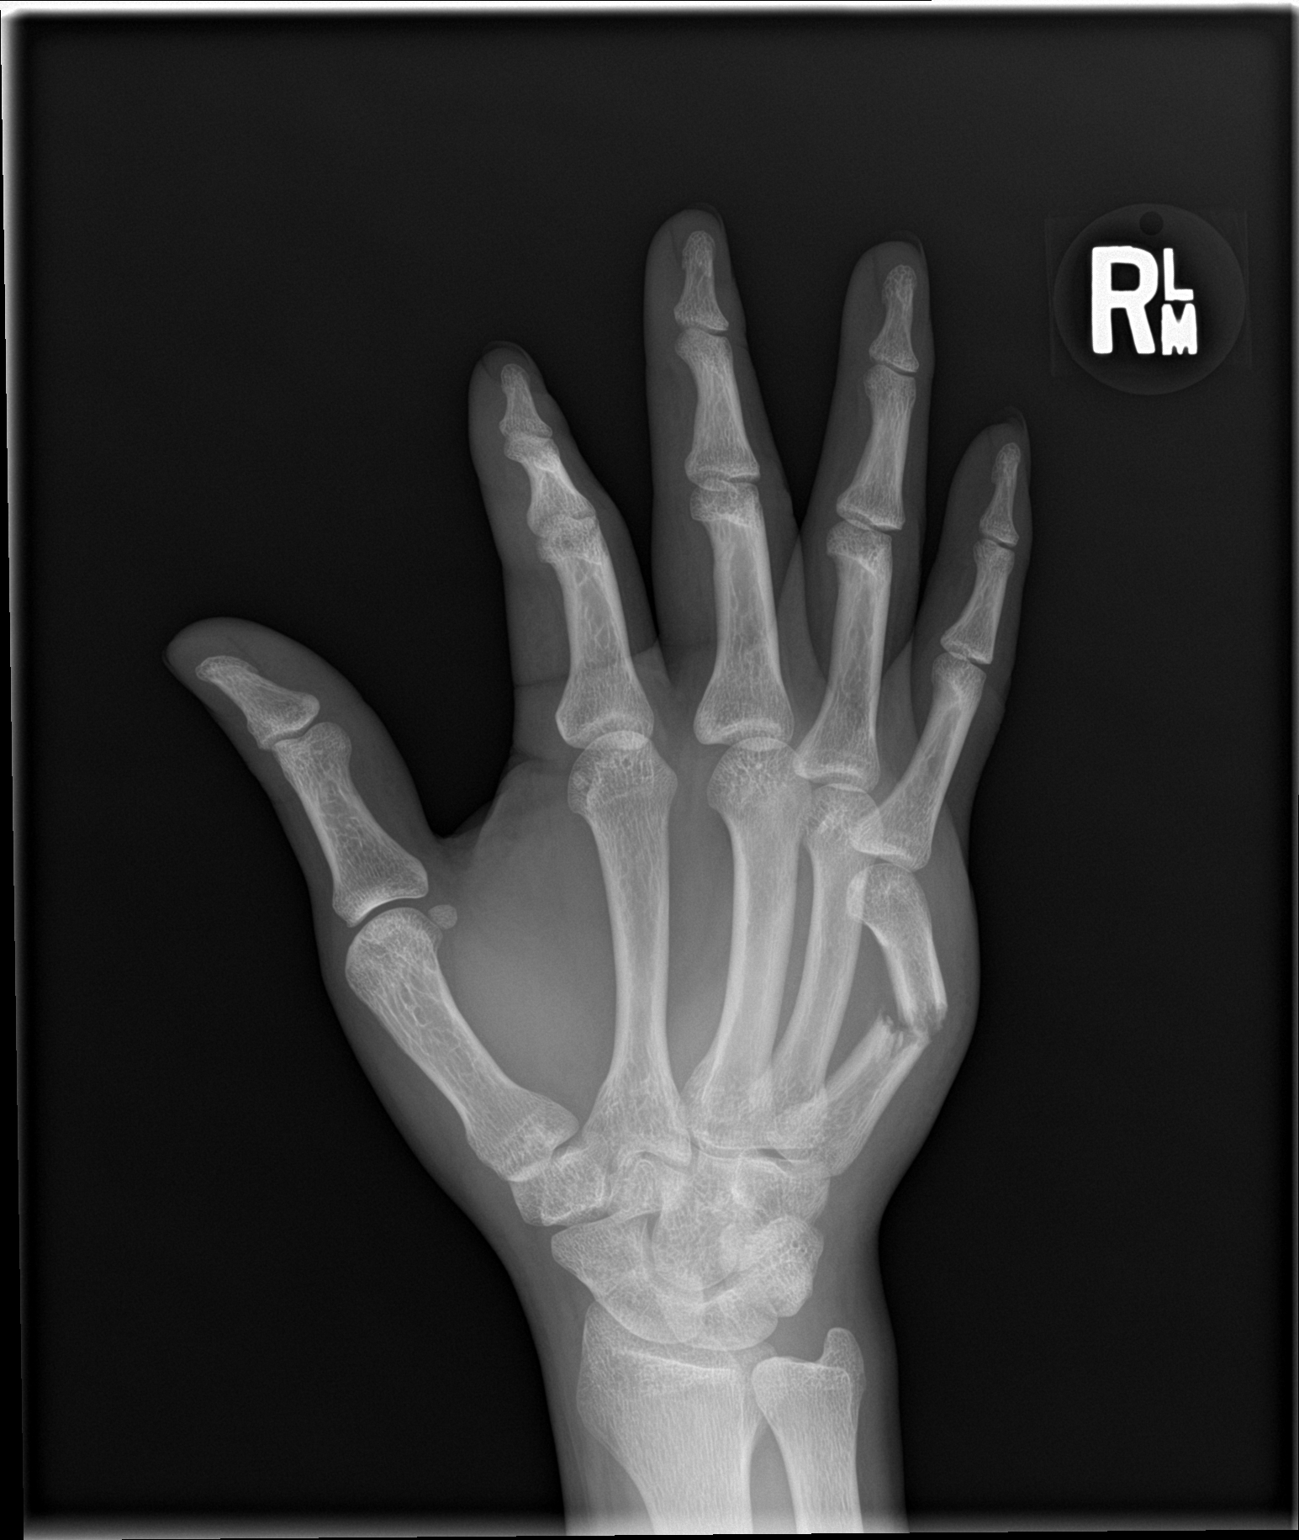

[hand lat]
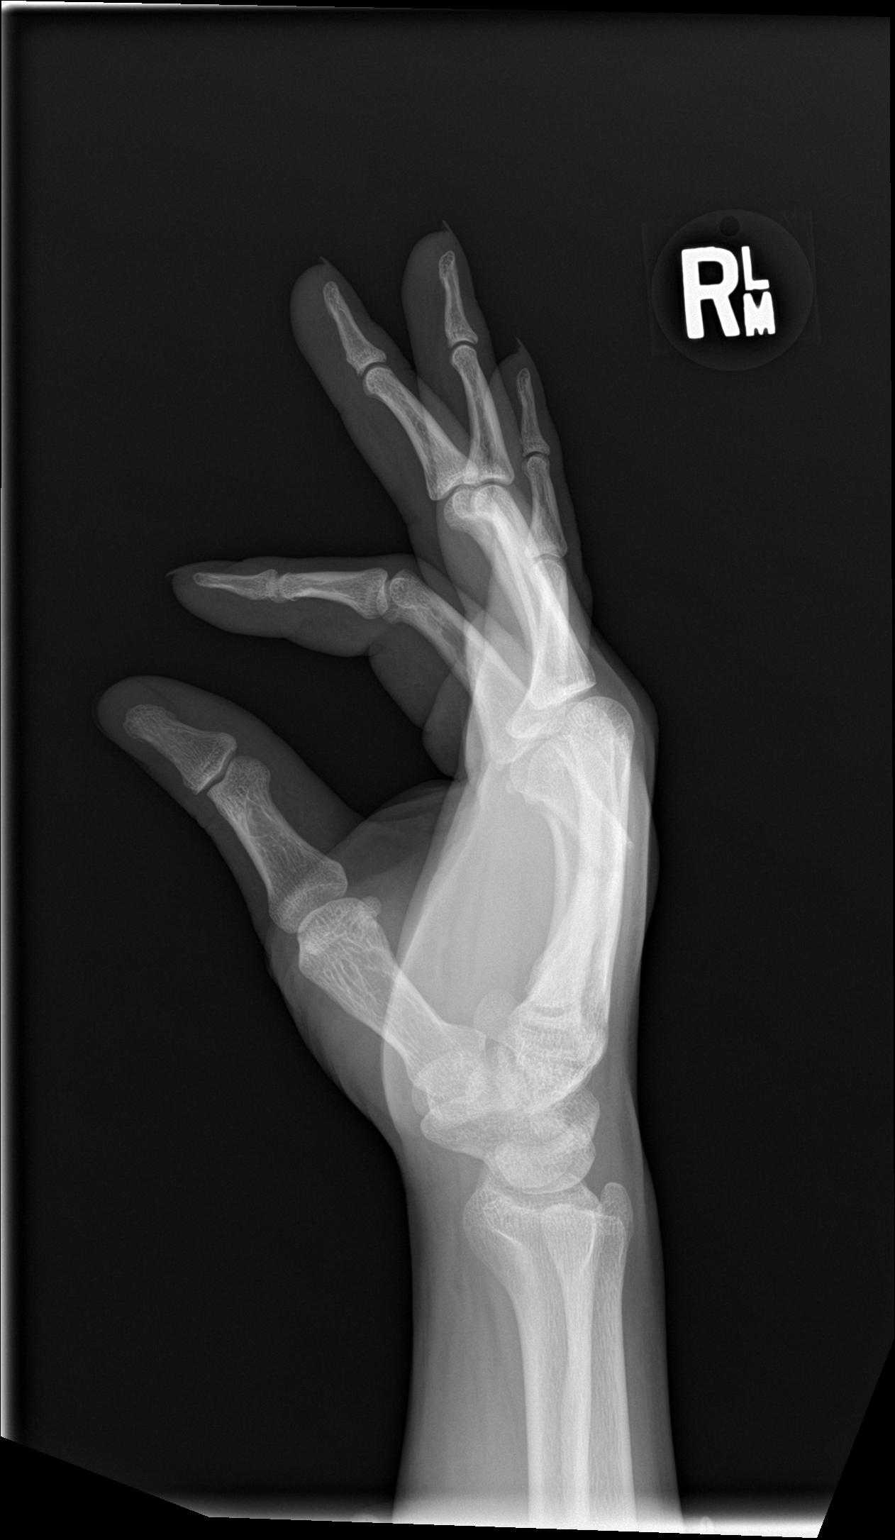

[3 of 3 positions shown; findings below may reference images not displayed]

FINDINGS: There is an angulated fracture through the midportion of the right
fifth metacarpal. No subluxation or dislocation. Overlying soft
tissue swelling.
IMPRESSION: Mildly angulated right fifth metacarpal fracture.

## 2019-10-27 ENCOUNTER — Encounter (HOSPITAL_COMMUNITY): Payer: Self-pay | Admitting: *Deleted

## 2019-10-27 ENCOUNTER — Other Ambulatory Visit: Payer: Self-pay

## 2019-10-27 ENCOUNTER — Emergency Department (HOSPITAL_COMMUNITY)
Admission: EM | Admit: 2019-10-27 | Discharge: 2019-10-27 | Disposition: A | Discharge status: Home / Self Care | Payer: Self-pay | Attending: Emergency Medicine | Admitting: Emergency Medicine

## 2019-10-27 ENCOUNTER — Encounter (HOSPITAL_COMMUNITY): Payer: Self-pay

## 2019-10-27 DIAGNOSIS — Z87891 Personal history of nicotine dependence: Secondary | ICD-10-CM | POA: Insufficient documentation

## 2019-10-27 DIAGNOSIS — K625 Hemorrhage of anus and rectum: Secondary | ICD-10-CM | POA: Insufficient documentation

## 2019-10-27 DIAGNOSIS — Z5321 Procedure and treatment not carried out due to patient leaving prior to being seen by health care provider: Secondary | ICD-10-CM | POA: Insufficient documentation

## 2019-10-27 DIAGNOSIS — Z79899 Other long term (current) drug therapy: Secondary | ICD-10-CM | POA: Insufficient documentation

## 2019-10-27 NOTE — ED Triage Notes (Signed)
States he had bright red blood when having a BM yesterday

## 2019-10-27 NOTE — ED Triage Notes (Signed)
Pt reports blood in stool x 3 days. This is happening intermittently, mostly with straining when trying to have bowel movement.

## 2019-10-28 ENCOUNTER — Emergency Department (HOSPITAL_COMMUNITY)
Admission: EM | Admit: 2019-10-28 | Discharge: 2019-10-28 | Disposition: A | Discharge status: Home / Self Care | Payer: Self-pay | Attending: Emergency Medicine | Admitting: Emergency Medicine

## 2019-10-28 ENCOUNTER — Emergency Department (HOSPITAL_COMMUNITY): Payer: Self-pay

## 2019-10-28 DIAGNOSIS — K625 Hemorrhage of anus and rectum: Secondary | ICD-10-CM

## 2019-10-28 LAB — COMPREHENSIVE METABOLIC PANEL
ALT: 38 U/L (ref 0–44)
AST: 25 U/L (ref 15–41)
Albumin: 4.5 g/dL (ref 3.5–5.0)
Alkaline Phosphatase: 68 U/L (ref 38–126)
Anion gap: 10 (ref 5–15)
BUN: 12 mg/dL (ref 6–20)
CO2: 26 mmol/L (ref 22–32)
Calcium: 9.4 mg/dL (ref 8.9–10.3)
Chloride: 101 mmol/L (ref 98–111)
Creatinine, Ser: 0.62 mg/dL (ref 0.61–1.24)
GFR calc Af Amer: >60 mL/min (ref >60)
GFR calc non Af Amer: >60 mL/min (ref >60)
Glucose, Bld: 111 mg/dL — ABNORMAL HIGH (ref 70–99)
Potassium: 3.6 mmol/L (ref 3.5–5.1)
Sodium: 137 mmol/L (ref 135–145)
Total Bilirubin: 1 mg/dL (ref 0.3–1.2)
Total Protein: 7.6 g/dL (ref 6.5–8.1)

## 2019-10-28 LAB — CBC WITH DIFFERENTIAL/PLATELET
Abs Immature Granulocytes: 0.02 10*3/uL (ref 0.00–0.07)
Basophils Absolute: 0 10*3/uL (ref 0.0–0.1)
Basophils Relative: 0 %
Eosinophils Absolute: 0.1 10*3/uL (ref 0.0–0.5)
Eosinophils Relative: 1 %
HCT: 42.8 % (ref 39.0–52.0)
Hemoglobin: 14.6 g/dL (ref 13.0–17.0)
Immature Granulocytes: 0 %
Lymphocytes Relative: 19 %
Lymphs Abs: 1.4 10*3/uL (ref 0.7–4.0)
MCH: 32.4 pg (ref 26.0–34.0)
MCHC: 34.1 g/dL (ref 30.0–36.0)
MCV: 94.9 fL (ref 80.0–100.0)
Monocytes Absolute: 0.7 10*3/uL (ref 0.1–1.0)
Monocytes Relative: 9 %
Neutro Abs: 5 10*3/uL (ref 1.7–7.7)
Neutrophils Relative %: 71 %
Platelets: 233 10*3/uL (ref 150–400)
RBC: 4.51 MIL/uL (ref 4.22–5.81)
RDW: 12.2 % (ref 11.5–15.5)
WBC: 7.1 10*3/uL (ref 4.0–10.5)
nRBC: 0 % (ref 0.0–0.2)

## 2019-10-28 LAB — POC OCCULT BLOOD, ED: Fecal Occult Bld: POSITIVE — AB

## 2019-10-28 MED ORDER — HYDROCORTISONE ACETATE 25 MG RE SUPP: 25.0000 mg | Freq: Two times a day (BID) | RECTAL | 0 refills | Status: AC

## 2019-10-28 MED ORDER — IOHEXOL 300 MG/ML  SOLN
100.0000 mL | Freq: Once | INTRAMUSCULAR | Status: AC | PRN
  Administered 2019-10-28: 100 mL via INTRAVENOUS

## 2019-10-28 NOTE — ED Provider Notes (Signed)
[facility] Provider Note   CSN: [id number] Arrival date & time: 10/27/19  2103   Time seen 3:15 AM  History Chief Complaint  Patient presents with  . Blood In Stools    [facility] is a 29 y.o. male.  HPI   Patient states for the past 4 days he is seeing blood per rectum after a bowel movement.  He states that the blood is leaking into the toilet.  He denies seeing any blood in his underwear.  He denies that the BMs are painful, he denies that they are hard and states they are soft.  He denies abdominal pain, nausea, or vomiting.  He states he has never had this happen before.  He states the last couple days he has felt dizzy and lightheaded at times.  Family history is negative for any type of GI problems including inflammatory bowel disease.  PCP Patient, No Pcp Per   Past Medical History:  Diagnosis Date  . Disp fracture of neck of right fifth metacarpal bone with malunion 12/18/2015  . Medical history non-contributory     Patient Active Problem List   Diagnosis Date Noted  . Disp fracture of neck of right fifth metacarpal bone with malunion 12/18/2015    Past Surgical History:  Procedure Laterality Date  . CLOSED REDUCTION METACARPAL WITH PERCUTANEOUS PINNING Right 12/18/2015   Procedure: CLOSED REDUCTION METACARPAL WITH PERCUTANEOUS PINNING;  Surgeon: Teryl Lucy, MD;  Location: [facility] OR;  Service: Orthopedics;  Laterality: Right;  . NO PAST SURGERIES         No family history on file.  Social History   Tobacco Use  . Smoking status: Former Games developer  . Smokeless tobacco: Never Used  . Tobacco comment: quit 1 month ago  Substance Use Topics  . Alcohol use: Yes    Alcohol/week: 7.0 standard drinks    Types: 7 Cans of beer per week  . Drug use: No    Home Medications Prior to Admission medications   Medication Sig Start Date End Date Taking? Authorizing Provider  hydrocortisone (ANUSOL-HC) 25 MG suppository Place 1 suppository (25 mg  total) rectally 2 (two) times daily. 10/28/19   Devoria Albe, MD  oxyCODONE-acetaminophen (ROXICET) 5-325 MG tablet Take 1-2 tablets by mouth every 6 (six) hours as needed for severe pain. 12/18/15   Teryl Lucy, MD  sennosides-docusate sodium (SENOKOT-S) 8.6-50 MG tablet Take 2 tablets by mouth daily. 12/18/15   Teryl Lucy, MD  none  Allergies    Patient has no known allergies.  Review of Systems   Review of Systems  All other systems reviewed and are negative.   Physical Exam Updated Vital Signs BP (!) 147/90 (BP Location: Right Arm)   Pulse 69   Temp 98.9 F (37.2 C) (Oral)   Resp 18   Ht 5\' 6"  (1.676 m)   Wt 75.8 kg   SpO2 100%   BMI 26.95 kg/m   Physical Exam Vitals and nursing note reviewed. Exam conducted with a chaperone present.  Constitutional:      General: He is not in acute distress.    Appearance: Normal appearance. He is normal weight. He is not toxic-appearing.  HENT:     Head: Normocephalic and atraumatic.  Eyes:     Extraocular Movements: Extraocular movements intact.     Conjunctiva/sclera: Conjunctivae normal.     Pupils: Pupils are equal, round, and reactive to light.  Cardiovascular:     Rate and Rhythm: Normal rate and regular  rhythm.     Pulses: Normal pulses.     Heart sounds: Normal heart sounds. No murmur heard.   Pulmonary:     Effort: Pulmonary effort is normal. No respiratory distress.     Breath sounds: Normal breath sounds.  Abdominal:     General: Abdomen is flat. Bowel sounds are normal.     Palpations: Abdomen is soft.     Tenderness: There is no abdominal tenderness.  Genitourinary:    Comments: Normal rectum without external hemorrhoids.  On rectal exam there is no stool in the vault.  There is no masses felt.  Hemoccult was positive without gross blood or stool obtained. Musculoskeletal:     Cervical back: Normal range of motion.  Skin:    General: Skin is warm and dry.  Neurological:     General: No focal deficit  present.     Mental Status: He is alert and oriented to person, place, and time.     Cranial Nerves: No cranial nerve deficit.  Psychiatric:        Mood and Affect: Mood normal.        Behavior: Behavior normal.        Thought Content: Thought content normal.     ED Results / Procedures / Treatments   Labs (all labs ordered are listed, but only abnormal results are displayed) Results for orders placed or performed during the [facility] encounter of 10/28/19  Comprehensive metabolic panel  Result Value Ref Range   Sodium 137 135 - 145 mmol/L   Potassium 3.6 3.5 - 5.1 mmol/L   Chloride 101 98 - 111 mmol/L   CO2 26 22 - 32 mmol/L   Glucose, Bld 111 (H) 70 - 99 mg/dL   BUN 12 6 - 20 mg/dL   Creatinine, Ser 2.22 0.61 - 1.24 mg/dL   Calcium 9.4 8.9 - 97.9 mg/dL   Total Protein 7.6 6.5 - 8.1 g/dL   Albumin 4.5 3.5 - 5.0 g/dL   AST 25 15 - 41 U/L   ALT 38 0 - 44 U/L   Alkaline Phosphatase 68 38 - 126 U/L   Total Bilirubin 1.0 0.3 - 1.2 mg/dL   GFR calc non Af Amer >60 >60 mL/min   GFR calc Af Amer >60 >60 mL/min   Anion gap 10 5 - 15  CBC with Differential  Result Value Ref Range   WBC 7.1 4.0 - 10.5 K/uL   RBC 4.51 4.22 - 5.81 MIL/uL   Hemoglobin 14.6 13.0 - 17.0 g/dL   HCT 89.2 39 - 52 %   MCV 94.9 80.0 - 100.0 fL   MCH 32.4 26.0 - 34.0 pg   MCHC 34.1 30.0 - 36.0 g/dL   RDW 11.9 41.7 - 40.8 %   Platelets 233 150 - 400 K/uL   nRBC 0.0 0.0 - 0.2 %   Neutrophils Relative % 71 %   Neutro Abs 5.0 1.7 - 7.7 K/uL   Lymphocytes Relative 19 %   Lymphs Abs 1.4 0.7 - 4.0 K/uL   Monocytes Relative 9 %   Monocytes Absolute 0.7 0 - 1 K/uL   Eosinophils Relative 1 %   Eosinophils Absolute 0.1 0 - 0 K/uL   Basophils Relative 0 %   Basophils Absolute 0.0 0 - 0 K/uL   Immature Granulocytes 0 %   Abs Immature Granulocytes 0.02 0.00 - 0.07 K/uL  POC occult blood, ED Provider will collect  Result Value Ref Range   Fecal Occult Bld POSITIVE (  A) NEGATIVE   Laboratory interpretation all  normal except positive Hemoccult    EKG None  Radiology CT Abdomen Pelvis W Contrast  Result Date: 10/28/2019 CLINICAL DATA:  Lower GI bleed EXAM: CT ABDOMEN AND PELVIS WITH CONTRAST TECHNIQUE: Multidetector CT imaging of the abdomen and pelvis was performed using the standard protocol following bolus administration of intravenous contrast. CONTRAST:  OMNIPAQUE IOHEXOL 300 MG/ML  SOLN COMPARISON:  None. FINDINGS: Lower chest: Lung bases are clear. Hepatobiliary: Liver is within normal limits. Gallbladder is unremarkable. No intrahepatic or extrahepatic ductal dilatation. Pancreas: Within normal limits. Spleen: Within normal limits. Adrenals/Urinary Tract: Adrenal glands are within normal limits. Kidneys are within normal limits.  No hydronephrosis. Bladder is within normal limits. Stomach/Bowel: Stomach is within normal limits. No evidence of bowel obstruction. Normal appendix (series 2/image 61). No colonic wall thickening or inflammatory changes. Vascular/Lymphatic: No evidence of abdominal aortic aneurysm. No suspicious abdominopelvic lymphadenopathy. Reproductive: Prostate is notable for a midline utricle cyst (series 2/image 39), benign. Other: No abdominopelvic ascites. Musculoskeletal: Visualized osseous structures are within normal limits. IMPRESSION: Negative CT abdomen/pelvis. Electronically Signed   By: Charline Bills M.D.   On: 10/28/2019 05:03    Procedures Procedures (including critical care time)  Medications Ordered in ED Medications  iohexol (OMNIPAQUE) 300 MG/ML solution 100 mL (100 mLs Intravenous Contrast Given 10/28/19 0448)    ED Course  I have reviewed the triage vital signs and the nursing notes.  Pertinent labs & imaging results that were available during my care of the patient were reviewed by me and considered in my medical decision making (see chart for details).    MDM Rules/Calculators/A&P                         Patient's Hemoccult was positive  although there was no gross blood or stool on the sample.  Laboratory testing was done and CT of the abdomen/pelvis was done.  Patient's tests are all normal, there is no anemia.  His vital signs have been stable.  I suspect he may have internal hemorrhoids, he was started on Anusol [facility] suppositories and referred to gastroenterology for further evaluation.  He was given precautions to return to the ED.  Final Clinical Impression(s) / ED Diagnoses Final diagnoses:  Rectal bleeding    Rx / DC Orders ED Discharge Orders         Ordered    hydrocortisone (ANUSOL-HC) 25 MG suppository  2 times daily        10/28/19 0527         Plan discharge  Devoria Albe, MD, Concha Pyo, MD 10/28/19 0530

## 2019-10-28 NOTE — Discharge Instructions (Addendum)
Use the suppositories as prescribed.  Please call Dr. Wilburt Finlay office to get a follow-up appointment for the blood you are seen when you have a bowel movement.  Return to the emergency department if you get abdominal pain, vomiting, pass lots of blood or get weak or dizzy.

## 2019-10-28 NOTE — ED Notes (Signed)
Patient transported to CT 

## 2023-01-28 ENCOUNTER — Encounter (HOSPITAL_COMMUNITY): Payer: Self-pay

## 2023-01-28 ENCOUNTER — Emergency Department (HOSPITAL_COMMUNITY)
Admission: EM | Admit: 2023-01-28 | Discharge: 2023-01-28 | Disposition: A | Discharge status: Home / Self Care | Payer: Self-pay | Attending: Emergency Medicine | Admitting: Emergency Medicine

## 2023-01-28 ENCOUNTER — Other Ambulatory Visit: Payer: Self-pay

## 2023-01-28 DIAGNOSIS — Z23 Encounter for immunization: Secondary | ICD-10-CM | POA: Insufficient documentation

## 2023-01-28 DIAGNOSIS — S01111A Laceration without foreign body of right eyelid and periocular area, initial encounter: Secondary | ICD-10-CM | POA: Insufficient documentation

## 2023-01-28 DIAGNOSIS — W010XXA Fall on same level from slipping, tripping and stumbling without subsequent striking against object, initial encounter: Secondary | ICD-10-CM | POA: Insufficient documentation

## 2023-01-28 DIAGNOSIS — Y9301 Activity, walking, marching and hiking: Secondary | ICD-10-CM | POA: Insufficient documentation

## 2023-01-28 DIAGNOSIS — S0181XA Laceration without foreign body of other part of head, initial encounter: Secondary | ICD-10-CM

## 2023-01-28 MED ORDER — LIDOCAINE-EPINEPHRINE (PF) 2 %-1:200000 IJ SOLN
20.0000 mL | Freq: Once | INTRAMUSCULAR | Status: AC
  Administered 2023-01-28: 20 mL
  Filled 2023-01-28: qty 20

## 2023-01-28 MED ORDER — TETANUS-DIPHTH-ACELL PERTUSSIS 5-2.5-18.5 LF-MCG/0.5 IM SUSY
0.5000 mL | PREFILLED_SYRINGE | Freq: Once | INTRAMUSCULAR | Status: AC
  Administered 2023-01-28: 0.5 mL via INTRAMUSCULAR
  Filled 2023-01-28: qty 0.5

## 2023-01-28 NOTE — ED Provider Notes (Signed)
[place] EMERGENCY DEPARTMENT AT [facility] Provider Note   CSN: [id number] Arrival date & time: 01/28/23  2009     History  Chief Complaint  Patient presents with   Fall   Laceration    [facility] is a 32 y.o. male.  Presenting to the ED for evaluation of fall.  He states he tripped over a Lego while walking into the house earlier today.  States he hit his head on the metal grill and suffered a laceration to the right eyebrow.  No loss of consciousness.  Some nausea but no vomiting.  No seizure-like activity.  Not on blood thinners.  No vision changes.  No numbness, weakness or tingling.  States he feels slightly dizzy.  He is not anticoagulated.  Unknown when last tetanus vaccine was.   Fall  Laceration      Home Medications Prior to Admission medications   Medication Sig Start Date End Date Taking? Authorizing Provider  hydrocortisone (ANUSOL-HC) 25 MG suppository Place 1 suppository (25 mg total) rectally 2 (two) times daily. 10/28/19   Devoria Albe, MD  oxyCODONE-acetaminophen (ROXICET) 5-325 MG tablet Take 1-2 tablets by mouth every 6 (six) hours as needed for severe pain. 12/18/15   Teryl Lucy, MD  sennosides-docusate sodium (SENOKOT-S) 8.6-50 MG tablet Take 2 tablets by mouth daily. 12/18/15   Teryl Lucy, MD      Allergies    Patient has no known allergies.    Review of Systems   Review of Systems  Skin:  Positive for wound.  All other systems reviewed and are negative.   Physical Exam Updated Vital Signs BP 127/72 (BP Location: Right Arm)   Pulse 84   Temp 98.2 F (36.8 C) (Oral)   Resp 16   Ht 5\' 7"  (1.702 m)   Wt 78.2 kg   SpO2 100%   BMI 27.02 kg/m  Physical Exam Vitals and nursing note reviewed.  Constitutional:      General: He is not in acute distress.    Appearance: Normal appearance. He is normal weight. He is not ill-appearing.  HENT:     Head: Normocephalic and atraumatic.      Comments: 2 cm laceration through the  right eyebrow perpendicular to the eyebrow.  No active bleeding.  No extension into eyelid. Pulmonary:     Effort: Pulmonary effort is normal. No respiratory distress.  Abdominal:     General: Abdomen is flat.  Musculoskeletal:        General: Normal range of motion.     Cervical back: Neck supple.  Skin:    General: Skin is warm and dry.  Neurological:     Mental Status: He is alert and oriented to person, place, and time.  Psychiatric:        Mood and Affect: Mood normal.        Behavior: Behavior normal.     ED Results / Procedures / Treatments   Labs (all labs ordered are listed, but only abnormal results are displayed) Labs Reviewed - No data to display  EKG None  Radiology No results found.  Procedures .Laceration Repair  Date/Time: 01/28/2023 10:13 PM  Performed by: Michelle Piper, PA-C Authorized by: Michelle Piper, PA-C   Consent:    Consent obtained:  Verbal   Consent given by:  Patient   Risks discussed:  Infection, poor cosmetic result and pain   Alternatives discussed:  No treatment Universal protocol:    Procedure explained and questions answered  to patient or proxy's satisfaction: yes     Relevant documents present and verified: yes     Test results available: yes     Imaging studies available: yes     Immediately prior to procedure, a time out was called: yes     Patient identity confirmed:  Verbally with patient and arm band Anesthesia:    Anesthesia method:  Local infiltration   Local anesthetic:  Lidocaine 2% WITH epi Laceration details:    Location:  Face   Face location:  R eyebrow   Length (cm):  3 Exploration:    Hemostasis achieved with:  Epinephrine Treatment:    Area cleansed with:  Shur-Clens Skin repair:    Repair method:  Sutures   Suture size:  5-0   Suture material:  Prolene   Suture technique:  Simple interrupted   Number of sutures:  4 Approximation:    Approximation:  Close Repair type:    Repair type:   Simple Post-procedure details:    Dressing:  Open (no dressing)   Procedure completion:  Tolerated well, no immediate complications     Medications Ordered in ED Medications  lidocaine-EPINEPHrine (XYLOCAINE W/EPI) 2 %-1:200000 (PF) injection 20 mL (20 mLs Infiltration Given 01/28/23 2135)  Tdap (BOOSTRIX) injection 0.5 mL (0.5 mLs Intramuscular Given 01/28/23 2142)    ED Course/ Medical Decision Making/ A&P                                 Medical Decision Making This patient presents to the ED for concern of fall, laceration, this involves an extensive number of treatment options, and is a complaint that carries with it a high risk of complications and morbidity.  The differential diagnosis includes laceration, abrasion, concussion, ICH  My initial workup includes laceration repair  Additional history obtained from: Nursing notes from this visit.  Afebrile, hemodynamically stable.  32 year old male presenting for evaluation of a fall and facial laceration.  [place] head CT rule negative.  Tetanus vaccine updated.  Laceration repaired as stated above.  He was educated on signs and symptoms of infection and suture removal time.  He was encouraged to have someone at home watch him for the next 2 days and return with any new or worsening symptoms.  He was given return precautions.  Stable at discharge.  At this time there does not appear to be any evidence of an acute emergency medical condition and the patient appears stable for discharge with appropriate outpatient follow up. Diagnosis was discussed with patient who verbalizes understanding of care plan and is agreeable to discharge. I have discussed return precautions with patient who verbalizes understanding. Patient encouraged to follow-up with their PCP within 1 week. All questions answered.  Patient's case discussed with Dr. Estell Harpin who agrees with plan to discharge with follow-up.   Note: Portions of this report may have been  transcribed using voice recognition software. Every effort was made to ensure accuracy; however, inadvertent computerized transcription errors may still be present.         Final Clinical Impression(s) / ED Diagnoses Final diagnoses:  Facial laceration, initial encounter    Rx / DC Orders ED Discharge Orders     None         Mora Bellman 01/28/23 2215    Bethann Berkshire, MD 01/30/23 1102    Bethann Berkshire, MD 01/30/23 1104

## 2023-01-28 NOTE — ED Triage Notes (Signed)
Pt reports:  Fall Tripped over a Runner, broadcasting/film/video on a metal grill Bleeding controlled at this time

## 2023-01-28 NOTE — Discharge Instructions (Signed)
You have been seen today for your complaint of facial laceration. Home care instructions are as follows:  Keep the wound clean and dry.  Clean with warm soapy water once daily after this Follow up with: Your primary care provider in 5 to 7 days for suture removal Please seek immediate medical care if you develop any of the following symptoms: You develop severe swelling around the wound. Your pain suddenly increases and is severe. You develop painful lumps near the wound or on skin anywhere else on your body. You have a red streak going away from your wound. The wound is on your hand or foot, and you cannot properly move a finger or toe. The wound is on your hand or foot, and you notice that your fingers or toes look pale or bluish. At this time there does not appear to be the presence of an emergent medical condition, however there is always the potential for conditions to change. Please read and follow the below instructions.  Do not take your medicine if  develop an itchy rash, swelling in your mouth or lips, or difficulty breathing; call 911 and seek immediate emergency medical attention if this occurs.  You may review your lab tests and imaging results in their entirety on your MyChart account.  Please discuss all results of fully with your primary care provider and other specialist at your follow-up visit.  Note: Portions of this text may have been transcribed using voice recognition software. Every effort was made to ensure accuracy; however, inadvertent computerized transcription errors may still be present.

## 2023-05-31 ENCOUNTER — Ambulatory Visit
Admission: EM | Admit: 2023-05-31 | Discharge: 2023-05-31 | Disposition: A | Discharge status: Home / Self Care | Payer: Self-pay | Attending: Nurse Practitioner | Admitting: Nurse Practitioner

## 2023-05-31 DIAGNOSIS — S81812A Laceration without foreign body, left lower leg, initial encounter: Secondary | ICD-10-CM

## 2023-05-31 NOTE — ED Triage Notes (Addendum)
 Pt reports he cut the interior part of his left lower leg earlier today.   Last tetanus 2024

## 2023-05-31 NOTE — Discharge Instructions (Signed)
 We put 8 sutures in your left leg today.  Please keep the first bandage on until tomorrow morning.  Thereafter, clean twice daily with soap and water and leave open to air.  If you develop signs or symptoms of infection, please return to be reevaluated.  Otherwise, return to have the stitches removed in 10 to 14 days.

## 2023-06-01 DIAGNOSIS — S81812A Laceration without foreign body, left lower leg, initial encounter: Secondary | ICD-10-CM

## 2023-06-01 NOTE — ED Provider Notes (Signed)
 [facility]    CSN: [id number] Arrival date & time: 05/31/23  1716      History   Chief Complaint Chief Complaint  Patient presents with   laceration left lower leg    HPI Charles Washington is a 33 y.o. male.   Patient presents today with a cut to left lower leg that he sustained a few hours ago while placing flooring.  Reports a knife cut him through his pants.  Denies any bleeding.  Does not take any medicines on a regular basis.  Tetanus shot up-to-date.    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 Authorizing Provider  hydrocortisone (ANUSOL-HC) 25 MG suppository Place 1 suppository (25 mg total) rectally 2 (two) times daily. 10/28/19   Devoria Albe, MD  oxyCODONE-acetaminophen (ROXICET) 5-325 MG tablet Take 1-2 tablets by mouth every 6 (six) hours as needed for severe pain. 12/18/15   Teryl Lucy, MD  sennosides-docusate sodium (SENOKOT-S) 8.6-50 MG tablet Take 2 tablets by mouth daily. 12/18/15   Teryl Lucy, MD    Family History History reviewed. No pertinent family history.  Social History Social History   Tobacco Use   Smoking status: Former   Smokeless tobacco: Never   Tobacco comments:    quit 1 month ago  Substance Use Topics   Alcohol use: Yes    Alcohol/week: 7.0 standard drinks of alcohol     Types: 7 Cans of beer per week   Drug use: No     Allergies   Patient has no known allergies.   Review of Systems Review of Systems Per HPI  Physical Exam Triage Vital Signs ED Triage Vitals  Encounter Vitals Group     BP 05/31/23 1738 (!) 155/91     Systolic BP Percentile --      Diastolic BP Percentile --      Pulse Rate 05/31/23 1738 89     Resp 05/31/23 1738 18     Temp 05/31/23 1738 99.2 F (37.3 C)     Temp Source 05/31/23 1738 Oral     SpO2 05/31/23 1738 98 %     Weight --      Height --      Head Circumference --      Peak Flow --      Pain Score 05/31/23 1739 2     Pain Loc --      Pain Education --      Exclude from Growth Chart --    No data found.  Updated Vital Signs BP (!) 155/91 (BP Location: Right Arm)   Pulse 89   Temp 99.2 F (37.3 C) (Oral)   Resp 18   SpO2 98%   Visual Acuity Right Eye Distance:   Left Eye Distance:   Bilateral Distance:  Right Eye Near:   Left Eye Near:    Bilateral Near:     Physical Exam Vitals and nursing note reviewed.  Constitutional:      General: He is not in acute distress.    Appearance: Normal appearance. He is not toxic-appearing.  HENT:     Head: Normocephalic and atraumatic.     Mouth/Throat:     Mouth: Mucous membranes are moist.  Pulmonary:     Effort: Pulmonary effort is normal. No respiratory distress.  Skin:    General: Skin is warm and dry.     Capillary Refill: Capillary refill takes less than 2 seconds.     Coloration: Skin is not jaundiced or pale.     Findings: Laceration present. No rash.          Comments: 6 cm laceration noted to left lower extremity.  No active drainage, swelling, redness, odor.  Neurological:     Mental Status: He is alert and oriented to person, place, and time.  Psychiatric:        Behavior: Behavior is cooperative.      UC Treatments / Results  Labs (all labs ordered are listed, but only abnormal results are displayed) Labs Reviewed - No data  to display  EKG   Radiology No results found.  Procedures Laceration Repair  Date/Time: 06/01/2023 12:38 PM  Performed by: Valentino Nose, NP Authorized by: Valentino Nose, NP   Consent:    Consent obtained:  Verbal   Consent given by:  Patient   Risks, benefits, and alternatives were discussed: yes     Risks discussed:  Pain, infection, poor cosmetic result and need for additional repair   Alternatives discussed:  Delayed treatment Universal protocol:    Procedure explained and questions answered to patient or proxy's satisfaction: yes     Patient identity confirmed:  Verbally with patient Anesthesia:    Anesthesia method:  Local infiltration   Local anesthetic:  Lidocaine 1% WITH epi Laceration details:    Location:  Leg   Leg location:  L lower leg   Length (cm):  6 Pre-procedure details:    Preparation:  Patient was prepped and draped in usual sterile fashion Exploration:    Limited defect created (wound extended): no     Wound exploration: wound explored through full range of motion     Contaminated: no   Treatment:    Area cleansed with:  Chlorhexidine and saline   Amount of cleaning:  Standard   Irrigation solution:  Sterile saline   Irrigation volume:  10   Irrigation method:  Syringe   Debridement:  None   Undermining:  None Skin repair:    Repair method:  Sutures   Suture size:  4-0   Suture material:  Prolene   Suture technique:  Simple interrupted   Number of sutures:  8 Approximation:    Approximation:  Close Repair type:    Repair type:  Simple Post-procedure details:    Dressing:  Antibiotic ointment and non-adherent dressing   Procedure completion:  Tolerated well, no immediate complications  (including critical care time)  Medications Ordered in UC Medications - No data to display  Initial Impression / Assessment and Plan / UC Course  I have reviewed the triage vital signs and the nursing notes.  Pertinent labs & imaging  results that were available during my care of the patient were reviewed by me and considered in my medical decision making (see chart for details).  Patient is well-appearing, normotensive, afebrile, not tachycardic, not tachypneic, oxygenating well on room air.    1. Laceration of left lower extremity, initial encounter Laceration repair as above Patient tolerated well Wound care discussed; discussed to return for signs or symptoms of infection Return in 10 to 14 days for suture removal  The patient was given the opportunity to ask questions.  All questions answered to their satisfaction.  The patient is in agreement to this plan.   Final Clinical Impressions(s) / UC Diagnoses   Final diagnoses:  Laceration of left lower extremity, initial encounter     Discharge Instructions      We put 8 sutures in your left leg today.  Please keep the first bandage on until tomorrow morning.  Thereafter, clean twice daily with soap and water and leave open to air.  If you develop signs or symptoms of infection, please return to be reevaluated.  Otherwise, return to have the stitches removed in 10 to 14 days.    ED Prescriptions   None    PDMP not reviewed this encounter.   Valentino Nose, NP 06/01/23 1239
